# Patient Record
Sex: Male | Born: 1985 | Race: White | Hispanic: No | Marital: Single | Smoking: Never smoker
Health system: Southern US, Community
[De-identification: ages and names within clinical notes are randomized; demographics above are authoritative.]

---
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
## 2005-09-30 ENCOUNTER — Emergency Department (HOSPITAL_COMMUNITY): Admission: EM | Admit: 2005-09-30 | Discharge: 2005-09-30 | Payer: Self-pay | Admitting: Emergency Medicine

## 2007-12-22 ENCOUNTER — Encounter: Admission: RE | Admit: 2007-12-22 | Discharge: 2007-12-22 | Payer: Self-pay | Admitting: Family Medicine

## 2009-08-21 ENCOUNTER — Emergency Department (HOSPITAL_COMMUNITY): Admission: EM | Admit: 2009-08-21 | Discharge: 2009-08-21 | Payer: Self-pay | Admitting: Emergency Medicine

## 2009-08-29 ENCOUNTER — Encounter: Admission: RE | Admit: 2009-08-29 | Discharge: 2009-08-29 | Payer: Self-pay | Admitting: Family Medicine

## 2018-01-20 ENCOUNTER — Encounter (INDEPENDENT_AMBULATORY_CARE_PROVIDER_SITE_OTHER): Payer: Self-pay | Admitting: Orthopaedic Surgery

## 2018-01-20 ENCOUNTER — Ambulatory Visit (INDEPENDENT_AMBULATORY_CARE_PROVIDER_SITE_OTHER): Payer: BLUE CROSS/BLUE SHIELD

## 2018-01-20 ENCOUNTER — Ambulatory Visit (INDEPENDENT_AMBULATORY_CARE_PROVIDER_SITE_OTHER): Payer: BLUE CROSS/BLUE SHIELD | Admitting: Orthopaedic Surgery

## 2018-01-20 DIAGNOSIS — M545 Low back pain: Secondary | ICD-10-CM

## 2018-01-20 MED ORDER — METHOCARBAMOL 500 MG PO TABS: ORAL_TABLET | ORAL | 0 refills | Status: DC

## 2018-01-20 MED ORDER — PREDNISONE 10 MG (21) PO TBPK
ORAL_TABLET | ORAL | 0 refills | Status: DC
Start: 2018-01-20 — End: 2018-12-28

## 2018-01-20 MED ORDER — NAPROXEN 500 MG PO TABS: ORAL_TABLET | ORAL | 1 refills | Status: DC

## 2018-01-20 NOTE — Progress Notes (Signed)
Office Visit Note   Patient: Jeremy Maldonado           Date of Birth: 02-23-1986           MRN: [id number] Visit Date: 01/20/2018              Requested by: No referring provider defined for this encounter. PCP: Jarome Matin, MD   Assessment & Plan: Visit Diagnoses:  1. Acute low back pain, unspecified back pain laterality, with sciatica presence unspecified     Plan: Impression is lumbar strain.  We will place the patient on muscle relaxers and a Sterapred taper.  Once he has finished the Sterapred taper, he will start with the Naprosyn.  He will take all of these with food and I have suggested also taking these with an H2 blocker or a PPI.  He will avoid strenuous activity for the next few weeks.  I have told him that it is okay to participate in yoga or pool activities.  He will follow-up with [place] in 3 weeks time for recheck.  That point, we may send him to formal physical therapy.  Call with concerns or questions in the meantime.  Follow-Up Instructions: Return in about 3 weeks (around 02/10/2018).   Orders:  Orders Placed This Encounter  Procedures  . XR Lumbar Spine 2-3 Views   Meds ordered this encounter  Medications  . methocarbamol (ROBAXIN) 500 MG tablet    Sig: Take one tab po bid prn muscle spasm/pain    Dispense:  20 tablet    Refill:  0  . predniSONE (STERAPRED UNI-PAK 21 TAB) 10 MG (21) TBPK tablet    Sig: Take as directed    Dispense:  21 tablet    Refill:  0  . naproxen (NAPROSYN) 500 MG tablet    Sig: Take one tab po bid with meals prn pain (do not start taking until finished with sterapred taper)    Dispense:  30 tablet    Refill:  1      Procedures: No procedures performed   Clinical Data: No additional findings.   Subjective: Chief Complaint  Patient presents with  . Lower Back - Pain    HPI patient is a pleasant 32 year old gentleman who presents to our clinic today with lower back pain.  This began approximately 4 days ago without  any known injury or change in activity.  He is a very active gentleman who plays a lot of lacrosse and runs approximately 5 miles several times a week.  The pain he has been having is to both sides of his lower back radiating into the groin and going down the top of his legs and into both ankles.  He describes this as a constant ache worse when he is sitting or lying.  He has tried Aleve and Tylenol without relief of symptoms.  He did try one Norco which did ease the pain.  He has gotten a little relief from sitting in a hot tub as well as using a heating pad.  No numbness, tingling or burning down either extremity.  No bowel or bladder change and no saddle paresthesias.  No fevers, chills or any other systemic symptoms.  No previous lumbar pathology.  Review of Systems as detailed in HPI.  All others reviewed and are negative.   Objective: Vital Signs: There were no vitals taken for this visit.  Physical Exam well-developed and well-nourished gentleman in no acute distress.  Alert and oriented x3.  Ortho Exam examination of his lumbar spine reveals increased pain with lumbar flexion.  He does get some relief with lumbar extension.  No spinous tenderness.  He does have slight bilateral paraspinous tenderness.  Positive straight leg raise both sides.  Negative logroll and negative Fater both sides.  No focal weakness.  He is neurovascularly intact distally.  Specialty Comments:  No specialty comments available.  Imaging: Xr Lumbar Spine 2-3 Views  Result Date: 01/20/2018 No acute or structural abnormalities    PMFS History: There are no active problems to display for this patient.  History reviewed. No pertinent past medical history.  History reviewed. No pertinent family history.  History reviewed. No pertinent surgical history. Social History   Occupational History  . Not on file  Tobacco Use  . Smoking status: Not on file  Substance and Sexual Activity  . Alcohol use: Not on file    . Drug use: Not on file  . Sexual activity: Not on file

## 2018-01-21 ENCOUNTER — Telehealth (INDEPENDENT_AMBULATORY_CARE_PROVIDER_SITE_OTHER): Payer: Self-pay | Admitting: Orthopaedic Surgery

## 2018-01-21 NOTE — Telephone Encounter (Signed)
FYI

## 2018-01-21 NOTE — Telephone Encounter (Signed)
Please advise 

## 2018-01-21 NOTE — Telephone Encounter (Signed)
Patient saw Jeremy Maldonado yesterday and has some questions about the medication he was given. He told her he takes a low dosage of hydrocodone from a previous procedure, he also took a prednisone last night and he said he got about 2 hours of sleep and is feeling very nauseous and out of it today. He is wondering if this is a side effect from taking them both together. He just wants to be sure hes taking the right meds.  Also patient is wondering if he can be given a work not for yesterday and today due to appt and also not being able to go in because of medication problem.  Please advise when possible # [phone]

## 2018-01-21 NOTE — Telephone Encounter (Signed)
I also gave him robaxin and my guess is that he took that last night with the norco.  The robaxin can make him sleepy alone.  I would only take robaxin at night, and if needed, take the norco during the day.  Still take the prednisone as this should not make him drowsy.

## 2018-01-22 NOTE — Telephone Encounter (Signed)
Tried to call patient no answer, LMOM. To return call to advise on message below.

## 2018-01-22 NOTE — Telephone Encounter (Signed)
Tried to call patient no answer. Will you try to call him tomorrow, since I will be out of the office please. Thank you.

## 2018-01-23 ENCOUNTER — Encounter (INDEPENDENT_AMBULATORY_CARE_PROVIDER_SITE_OTHER): Payer: Self-pay

## 2018-01-23 NOTE — Telephone Encounter (Signed)
I just tried to call as well. Voicemail not set up to leave a message

## 2018-01-23 NOTE — Telephone Encounter (Signed)
IC: patient said he did not take any of the Robaxin, so it was not that that caused his symptoms.  He is feeling much better now, and was able to return to work on 01/22/18. I mailed a work note to him for 7/09-7/10/19.

## 2018-02-06 ENCOUNTER — Telehealth (INDEPENDENT_AMBULATORY_CARE_PROVIDER_SITE_OTHER): Payer: Self-pay | Admitting: Orthopaedic Surgery

## 2018-02-06 NOTE — Telephone Encounter (Signed)
Patient called checking status of request for records. I advised him records were mailed on 7/23 and it may take a little longer to get to him as our mail is out sourced. Told him if he needs sooner than it arrives to call me back.

## 2018-02-10 ENCOUNTER — Encounter (INDEPENDENT_AMBULATORY_CARE_PROVIDER_SITE_OTHER): Payer: Self-pay | Admitting: Orthopaedic Surgery

## 2018-02-10 ENCOUNTER — Ambulatory Visit (INDEPENDENT_AMBULATORY_CARE_PROVIDER_SITE_OTHER): Payer: BLUE CROSS/BLUE SHIELD | Admitting: Physician Assistant

## 2018-02-10 DIAGNOSIS — M545 Low back pain, unspecified: Secondary | ICD-10-CM | POA: Insufficient documentation

## 2018-02-10 NOTE — Progress Notes (Signed)
      Patient: Jeremy Maldonado           Date of Birth: 09/11/1985           MRN: [id number] Visit Date: 02/10/2018 PCP: Jarome MatinPaterson, Daniel, MD   Assessment & Plan:  Chief Complaint:  Chief Complaint  Patient presents with  . Lower Back - Pain, Follow-up   Visit Diagnoses:  1. Acute low back pain, unspecified back pain laterality, with sciatica presence unspecified     Plan: Patient comes in for follow-up for lumbar strain.  This occurred approximately 4 weeks ago.  He was seen by us where he was placed on a Sterapred taper as well as given a prescription for muscle relaxers.  He finished a steroid taper and only took a few muscle relaxers.  His pain has dramatically improved.  He has been avoiding physical activity over the past few weeks.  Overall doing dramatically better.  Lumbar exam is stable.  At this point, we will send the patient to formal physical therapy for 1 session for him to be instructed on core strengthening exercises and others that he is able to do on his own at home.  He will gradually increase activity as tolerated.  Follow-up with us as needed.  Follow-Up Instructions: Return if symptoms worsen or fail to improve.   Orders:  No orders of the defined types were placed in this encounter.  No orders of the defined types were placed in this encounter.   Imaging: No results found.  PMFS History: Patient Active Problem List   Diagnosis Date Noted  . Acute low back pain 02/10/2018   History reviewed. No pertinent past medical history.  History reviewed. No pertinent family history.  History reviewed. No pertinent surgical history. Social History   Occupational History  . Not on file  Tobacco Use  . Smoking status: Never Smoker  . Smokeless tobacco: Never Used  Substance and Sexual Activity  . Alcohol use: Not on file  . Drug use: Not on file  . Sexual activity: Not on file

## 2018-12-28 ENCOUNTER — Encounter: Payer: Self-pay | Admitting: Family Medicine

## 2018-12-28 ENCOUNTER — Other Ambulatory Visit: Payer: Self-pay

## 2018-12-28 ENCOUNTER — Ambulatory Visit (INDEPENDENT_AMBULATORY_CARE_PROVIDER_SITE_OTHER): Payer: BLUE CROSS/BLUE SHIELD | Admitting: Family Medicine

## 2018-12-28 VITALS — BP 143/81 | HR 72 | Temp 97.4°F | Resp 12 | Ht 71.75 in | Wt 205.5 lb

## 2018-12-28 DIAGNOSIS — M545 Low back pain, unspecified: Secondary | ICD-10-CM

## 2018-12-28 DIAGNOSIS — Z8639 Personal history of other endocrine, nutritional and metabolic disease: Secondary | ICD-10-CM | POA: Diagnosis not present

## 2018-12-28 DIAGNOSIS — Z0000 Encounter for general adult medical examination without abnormal findings: Secondary | ICD-10-CM | POA: Diagnosis not present

## 2018-12-28 DIAGNOSIS — Z8249 Family history of ischemic heart disease and other diseases of the circulatory system: Secondary | ICD-10-CM

## 2018-12-28 DIAGNOSIS — E785 Hyperlipidemia, unspecified: Secondary | ICD-10-CM

## 2018-12-28 DIAGNOSIS — G8929 Other chronic pain: Secondary | ICD-10-CM

## 2018-12-28 NOTE — Progress Notes (Signed)
Office Visit Note   Patient: Jeremy SicklesRandall S Sowash           Date of Birth: 10/04/1985           MRN: [id number] Visit Date: 12/28/2018 Requested by: Jarome MatinPaterson, Daniel, MD [address] [place],  [place] [id number] PCP: Jarome MatinPaterson, Daniel, MD  Subjective: Chief Complaint  Patient presents with  . wellness exam    HPI: He is here for a wellness examination.  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  Fortunately he had an event occur that led to his placement in [facility].  He spent a couple weeks there and is now "clean".  He is very grateful for this.  He has been dealing with chronic low back pain which led to his hydrocodone addiction.  He is not sure exactly what caused his pain but it has been bothering him for at least a year.  He thinks it might be related to years of lacrosse activities.  The pain is midline lumbosacral area, worse on the left side, with occasional radiation into the legs but no numbness or weakness.  This morning he is actually feeling pretty well.  Exercise seems to improve his pain temporarily.  He has a family history of early heart attack in his father who died at age 242.  His father was also a smoker.  His paternal grandfather also died young but causes uncertain.  He has many other relatives with no heart disease, often people live into their 90s.  His paternal uncle is alive and well in his 3270s.  During his recent stay [facility], he had labs showing a very mildly elevated glucose and elevated lipids.  Hemoglobin A1c was normal at 5.4.  Patient states that leading up to that, he was not eating healthfully but is now eating well and exercising regularly again.  There is no family history of diabetes to his knowledge.  He is up-to-date on eye exams, dental visits, immunizations.  He is working for [employer] and enjoys his job.                ROS: Denies fevers or chills.  All other systems were reviewed and are negative.   Objective: Vital Signs: BP (!) 143/81 (BP Location: Right Arm, Patient Position: Sitting, Cuff Size: Normal)   Pulse 72   Temp (!) 97.4 F (36.3 C)   Resp 12   Ht 5' 11.75" (1.822 m)   Wt 205 lb 8 oz (93.2 kg)   SpO2 98%   BMI 28.07 kg/m   Physical Exam:  General:  Alert and oriented, in no acute distress. Pulm:  Breathing unlabored. Psy:  Normal mood, congruent affect. Skin: No rash on his skin. HEENT:  [place]/AT, PERRLA, EOM Full, no nystagmus.  Funduscopic examination within normal limits.  No conjunctival erythema.  Tympanic membranes are pearly gray with normal landmarks.  External ear canals are normal.  Nasal passages are clear.  Oropharynx is clear.  No significant lymphadenopathy.  No thyromegaly or nodules.  2+ carotid pulses without bruits. CV: Regular rate and rhythm without murmurs, rubs, or gallops.  No peripheral edema.  2+ radial and posterior tibial pulses. Lungs: Clear to auscultation throughout with no wheezing or areas of consolidation. Abd: Bowel sounds are active, no hepatosplenomegaly or masses.  Soft and nontender.  No audible bruits.  No evidence of ascites. Low back: No scoliosis, leg lengths appear equal.  He is tender to deep palpation in the  paraspinous muscles immediately to the left of L5 spinous process.  He also has pain with 1 legged hyperextension right greater than left, and the pain is mostly on the left side.  Lower extremity strength and reflexes are normal.   Imaging: None today.  Previous lumbar x-rays show early L4-5 facet degenerative changes.  Assessment & Plan: 1.  Wellness examination -Overall I think he is doing well from a health standpoint.  I am not too concerned about his recent blood sugar and cholesterol numbers.  He is now maintaining a healthy lifestyle and we will recheck fasting labs in about a year.  2.  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 - We will treat aggressively with physical  therapy and chiropractic. -If symptoms do not improve, then possibly lumbar MRI scan.  3.  Family history of early heart disease in father - When he reaches age 60 we will order a CT calcium score.     Procedures: No procedures performed  No notes on file     PMFS History: Patient Active Problem List   Diagnosis Date Noted  . Family history of early CAD 12/28/2018   History reviewed. No pertinent past medical history.  Family History  Problem Relation Age of Onset  . Osteoarthritis Mother   . Heart attack Father 25  . Hyperlipidemia Father   . Cancer Neg Hx     History reviewed. No pertinent surgical history. Social History   Occupational History  . Not on file  Tobacco Use  . Smoking status: Never Smoker  . Smokeless tobacco: Never Used  Substance and Sexual Activity  . Alcohol use: Not on file  . Drug use: Not on file  . Sexual activity: Not on file

## 2018-12-28 NOTE — Patient Instructions (Signed)
    Vitamin D3:  Take 5,000 IU daily Vitamin K2:  100 mcg daily Magnesium:  200-400 mg daily  Glucosamine Sulfate:  1,000 mg twice daily

## 2019-01-21 ENCOUNTER — Encounter: Payer: Self-pay | Admitting: Family Medicine

## 2019-02-08 ENCOUNTER — Encounter: Payer: Self-pay | Admitting: Family Medicine

## 2019-03-24 ENCOUNTER — Encounter: Payer: Self-pay | Admitting: Family Medicine

## 2019-04-23 ENCOUNTER — Other Ambulatory Visit: Payer: Self-pay

## 2019-04-23 ENCOUNTER — Encounter: Payer: Self-pay | Admitting: Family Medicine

## 2019-04-23 ENCOUNTER — Ambulatory Visit (INDEPENDENT_AMBULATORY_CARE_PROVIDER_SITE_OTHER): Payer: BLUE CROSS/BLUE SHIELD | Admitting: Family Medicine

## 2019-04-23 DIAGNOSIS — M545 Low back pain, unspecified: Secondary | ICD-10-CM

## 2019-04-23 DIAGNOSIS — G8929 Other chronic pain: Secondary | ICD-10-CM | POA: Diagnosis not present

## 2019-04-23 DIAGNOSIS — F1129 Opioid dependence with unspecified opioid-induced disorder: Secondary | ICD-10-CM

## 2019-04-23 MED ORDER — QUETIAPINE FUMARATE 25 MG PO TABS: 25.0000 mg | ORAL_TABLET | Freq: Every evening | ORAL | 0 refills | Status: DC | PRN

## 2019-04-23 NOTE — Progress Notes (Signed)
   Office Visit Note   Patient: Jeremy Maldonado           Date of Birth: November 10, 1985           MRN: [id number] Visit Date: 04/23/2019 Requested by: Leanna Battles, MD [address] [place],   [address] PCP: Leanna Battles, MD  Subjective: Chief Complaint  Patient presents with  . Lower Back - Pain    States low back pain for a while.  Pain stays in lower back. No pain while walking, sitting, or standing. No Pain medicine.  Has been to drug treatment.    HPI: He is here for right-sided low back pain.  Pain in low back for at least a year.  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  He is now working with [facility] and is going through withdrawals, but plans to be successful this time around.  He is living with his mother who is with him today.  He would like help managing his back pain, and would also like a prescription for Seroquel to help him sleep, and a referral to a psychiatrist to possibly prescribe vivitrol.  For his back pain, he underwent a course of chiropractic per Dr. Jimmye Norman but unfortunately it really did not make much difference in his pain.  He does not have radicular pain.  No bowel or bladder dysfunction.              ROS: No fevers or chills.  All other systems were reviewed and are negative.  Objective: Vital Signs: There were no vitals taken for this visit.  Physical Exam:  General:  Alert and oriented, in no acute distress. Pulm:  Breathing unlabored. Psy:  Normal mood, congruent affect. Skin: No visible rash. Low back: His tenderness today is in the deep paraspinous muscles to the right of midline at about L4-5.  No significant spinous process tenderness, no tenderness over the SI joints today.  Negative straight leg raise, lower extremity strength and reflexes are normal.  Imaging:  None today.  Assessment & Plan: 1.  Chronic right-sided low back pain, possibly myofascial.  Question facet syndrome. -We will try intramuscular injection into the myofascial trigger point today.  Referral to [facility] for physical therapy.  If he fails to improve, then lumbar MRI scan.  2.  Opioid dependence -He is working with [employer], [employer].  Prescription given for Seroquel to help him sleep.  Referral to psychiatry.     Procedures: No procedures performed  No notes on file     PMFS History: Patient Active Problem List   Diagnosis Date Noted  . Family history of early CAD 12/28/2018   History reviewed. No pertinent past medical history.  Family History  Problem Relation Age of Onset  . Osteoarthritis Mother   . Heart attack Father 46  . Hyperlipidemia Father   . Cancer Neg Hx     History reviewed. No pertinent surgical history. Social History   Occupational History  . Not on file  Tobacco Use  . Smoking status: Never Smoker  . Smokeless tobacco: Never Used  Substance and Sexual Activity  . Alcohol use: Not on file  . Drug use: Not on file  . Sexual activity: Not on file

## 2019-04-28 ENCOUNTER — Telehealth (HOSPITAL_COMMUNITY): Payer: Self-pay | Admitting: Psychiatry

## 2019-04-28 NOTE — Telephone Encounter (Signed)
D:  Referral Coordinator for [facility] referred pt to [facility].  A:  Placed call to orient and provide pt with a start date.  According to pt, he just completed [facility] and can't attend [facility] due to his work schedule.  Pt is just requesting a psychiatrist who administers Vivotrol.  Pt states he was given two clinics/offices who do it.  Writer recommended pt to give Dr. Ephriam Jenkins office a call.  Also, encouraged pt to obtain a sponsor and attend AA/NA mgts.  R:  Pt receptive.

## 2019-06-24 ENCOUNTER — Other Ambulatory Visit: Payer: Self-pay

## 2019-06-24 DIAGNOSIS — Z20822 Contact with and (suspected) exposure to covid-19: Secondary | ICD-10-CM

## 2019-06-26 LAB — NOVEL CORONAVIRUS, NAA: SARS-CoV-2, NAA: DETECTED — AB

## 2020-08-03 ENCOUNTER — Ambulatory Visit (INDEPENDENT_AMBULATORY_CARE_PROVIDER_SITE_OTHER): Payer: Managed Care, Other (non HMO)

## 2020-08-03 ENCOUNTER — Ambulatory Visit (INDEPENDENT_AMBULATORY_CARE_PROVIDER_SITE_OTHER): Payer: Managed Care, Other (non HMO) | Admitting: Family Medicine

## 2020-08-03 ENCOUNTER — Other Ambulatory Visit: Payer: Self-pay

## 2020-08-03 DIAGNOSIS — M25562 Pain in left knee: Secondary | ICD-10-CM

## 2020-08-03 DIAGNOSIS — M25362 Other instability, left knee: Secondary | ICD-10-CM

## 2020-08-03 NOTE — Progress Notes (Signed)
Office Visit Note   Patient: Jeremy Maldonado           Date of Birth: 06/26/1986           MRN: [id number] Visit Date: 08/03/2020 Requested by: Lavada Mesi, MD [address] [place],  [place] [address] PCP: Lavada Mesi, MD  Subjective: Chief Complaint  Patient presents with  . Left Knee - Pain    Injured 3 days ago, doing jiu jitsu drills. His partner put him into submission with a move called a leg bar. The patient heard and felt a pop, but it did not hurt initially. Started hurting that evening at home and worsened by the next day. Pain medial aspect of knee. Walks with a limp. Been icing, heating and elevating the leg. He said the knee has that "empty" feeling, like when he tore the ACL on the right knee.     HPI: He is here with left knee instability.  3 days ago practicing jujitsu, his partner put him into a submission hold and his knee popped.  It really did not hurt right then and everything seemed to be okay, so he finished his workout.  After he went home and had dinner, he stood up and his knee did not feel right.  He tried some maneuvers and never noticed any swelling, but the knee continued to feel a bit unstable.  Over the next couple days it is starting to feel "empty", just like when he tore his right knee ACL.  His right knee is doing very well, he had reconstruction per Dr. August Saucer.                ROS:   All other systems were reviewed and are negative.  Objective: Vital Signs: There were no vitals taken for this visit.  Physical Exam:  General:  Alert and oriented, in no acute distress. Pulm:  Breathing unlabored. Psy:  Normal mood, congruent affect. Skin: No bruising Left knee: There is trace effusion, no warmth.  No patellofemoral crepitus, negative patella apprehension.  I am unable to feel a solid endpoint with Lachman's.  Anterior drawer has laxity as well, PCL feels solid.  There is slight pain with valgus stress but a solid endpoint.  No palpable click with  McMurray's.  Imaging: XR Knee 1-2 Views Left  Result Date: 08/03/2020 X-rays show normal anatomic alignment of the knee with no degenerative change, no sign of loose body, no Segond fracture.   Assessment & Plan: 1. left knee instability, worrisome for ACL tear. -Elected to proceed with MRI scan.  If ACL torn, we will have him meet with Dr. August Saucer.     Procedures: No procedures performed        PMFS History: Patient Active Problem List   Diagnosis Date Noted  . Family history of early CAD 12/28/2018   No past medical history on file.  Family History  Problem Relation Age of Onset  . Osteoarthritis Mother   . Heart attack Father 80  . Hyperlipidemia Father   . Cancer Neg Hx     No past surgical history on file. Social History   Occupational History  . Not on file  Tobacco Use  . Smoking status: Never Smoker  . Smokeless tobacco: Never Used  Substance and Sexual Activity  . Alcohol use: Not on file  . Drug use: Not on file  . Sexual activity: Not on file

## 2020-08-27 ENCOUNTER — Other Ambulatory Visit: Payer: Managed Care, Other (non HMO)

## 2020-09-15 ENCOUNTER — Ambulatory Visit
Admission: RE | Admit: 2020-09-15 | Discharge: 2020-09-15 | Disposition: A | Discharge status: Home / Self Care | Payer: Managed Care, Other (non HMO) | Source: Ambulatory Visit | Attending: Family Medicine | Admitting: Family Medicine

## 2020-09-15 ENCOUNTER — Other Ambulatory Visit: Payer: Self-pay

## 2020-09-15 DIAGNOSIS — M25362 Other instability, left knee: Secondary | ICD-10-CM

## 2020-09-15 DIAGNOSIS — M25562 Pain in left knee: Secondary | ICD-10-CM

## 2020-09-18 ENCOUNTER — Telehealth: Payer: Self-pay | Admitting: Family Medicine

## 2020-09-18 NOTE — Telephone Encounter (Signed)
Good news!  ACL and meniscus cartilages do not look torn.  Just bone contusions, which will heal with rest and time.

## 2021-09-04 IMAGING — MR MR KNEE*L* W/O CM
6 of 7 series · 29 of 40 positions shown · non-contrast
Comparison: Plain films left knee 08/03/2020. MRI left knee
12/23/2007.

CLINICAL DATA: Left knee pain since the patient felt a pop in his
knee while doing jujitsu 2 months ago.

EXAM:
MRI OF THE LEFT KNEE WITHOUT CONTRAST
TECHNIQUE: Multiplanar, multisequence MR imaging of the knee was performed. No
intravenous contrast was administered.

[Series 3: T2 fat-sat · axial · 4.0mm · 0.50mm/px · z∈[-52,+63]mm · 6 of 24 slices shown (1 of 3)]
[im 1/24]
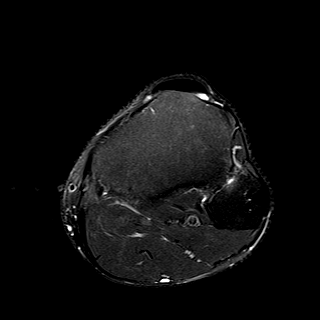
[im 5/24]
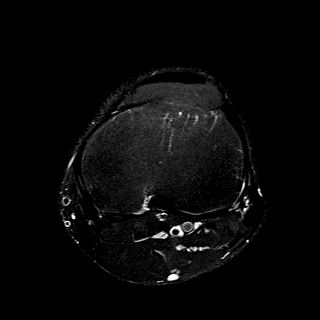
[im 10/24]
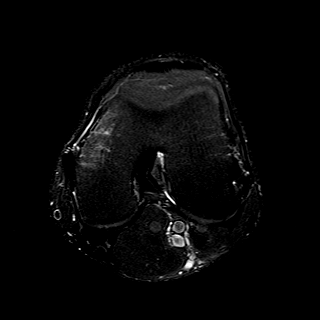
[im 14/24]
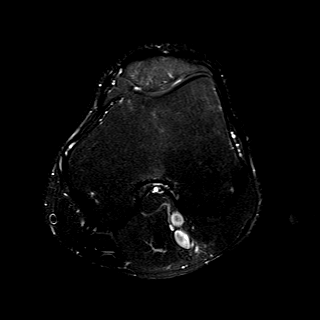
[im 19/24]
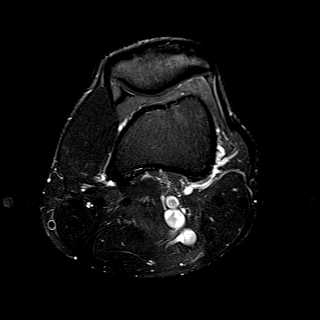
[im 24/24]
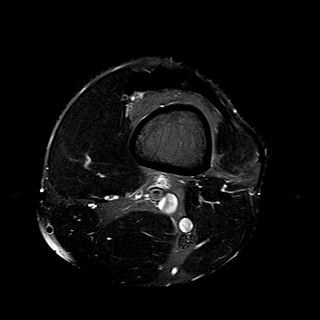

[Series 5: T2 fat-sat · coronal · 4.0mm · 0.29mm/px · 5 of 22 slices shown (2 of 3)]
[im 1/22]
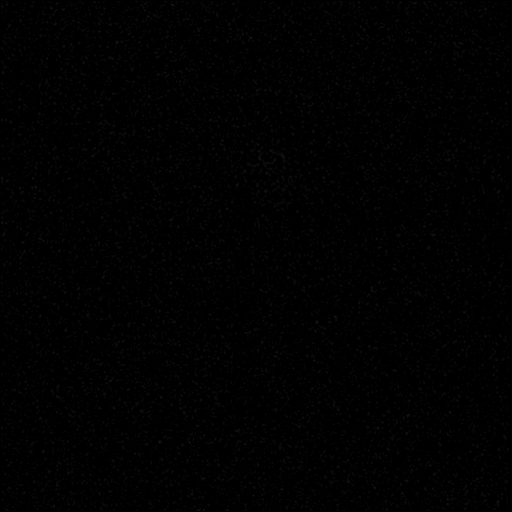
[im 6/22]
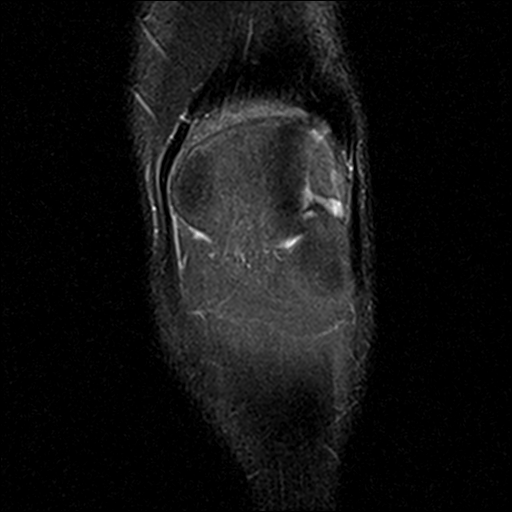
[im 11/22]
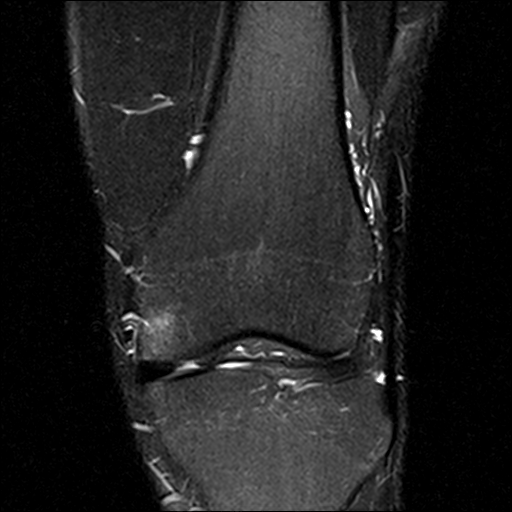
[im 16/22]
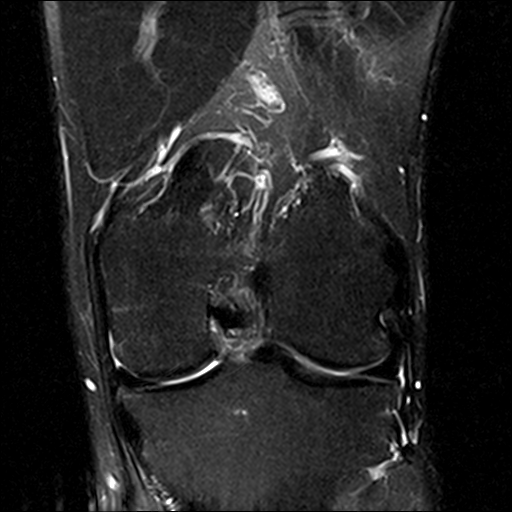
[im 22/22]
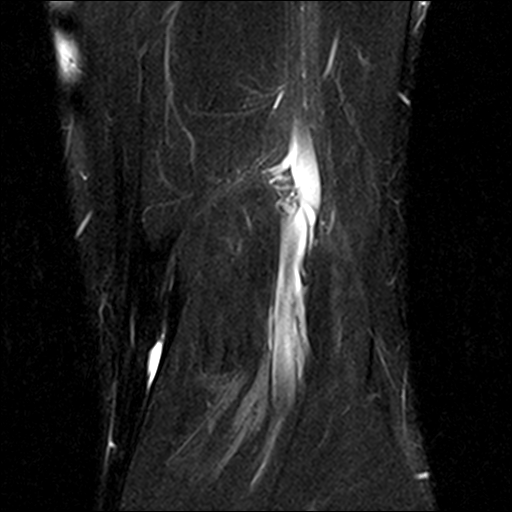

[Series 6: T2 fat-sat · sagittal · 3.0mm · 0.29mm/px · 1 of 29 slices shown (3 of 3)]
[im 1/29]
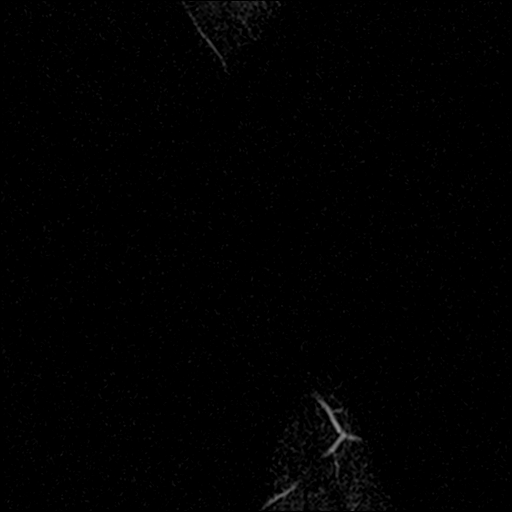

[Series 7: PD fat-sat · sagittal · 3.0mm · 0.39mm/px · 7 of 29 slices shown (1 of 3)]
[im 1/29]
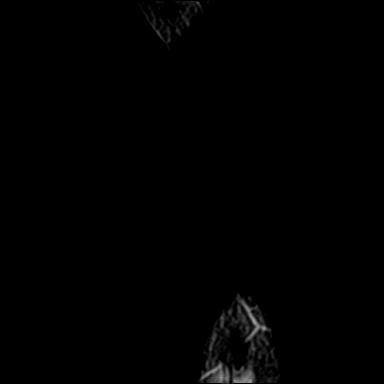
[im 5/29]
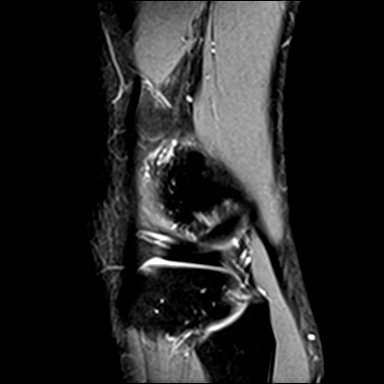
[im 10/29]
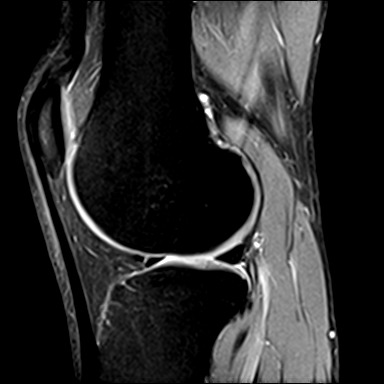
[im 15/29]
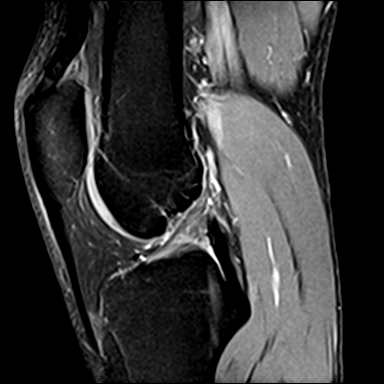
[im 19/29]
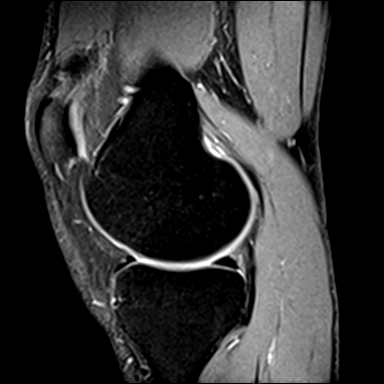
[im 24/29]
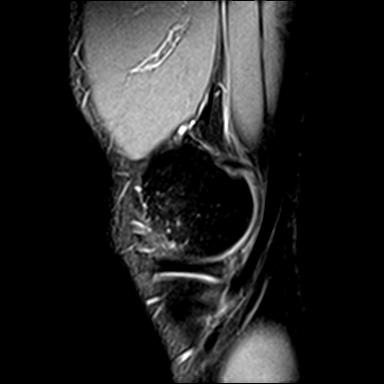
[im 29/29]
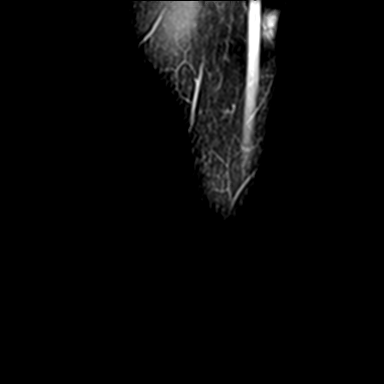

[Series 8: PD fat-sat · coronal · 3.0mm · 0.39mm/px · 7 of 28 slices shown (2 of 3)]
[im 1/28]
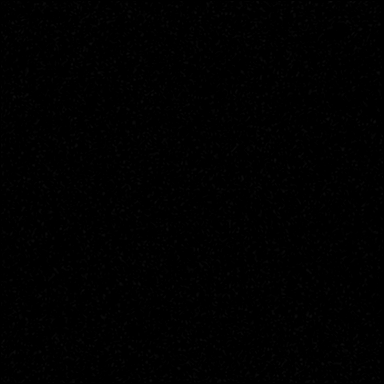
[im 5/28]
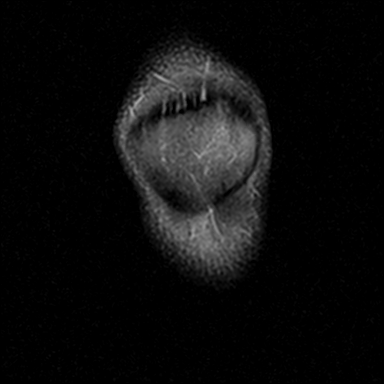
[im 10/28]
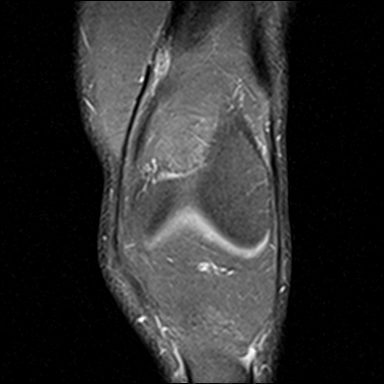
[im 14/28]
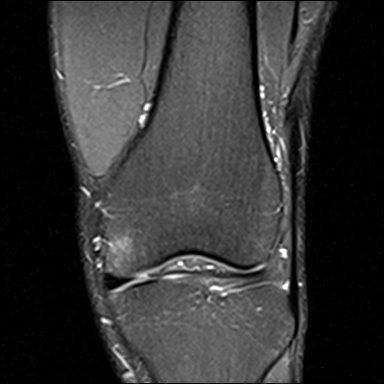
[im 19/28]
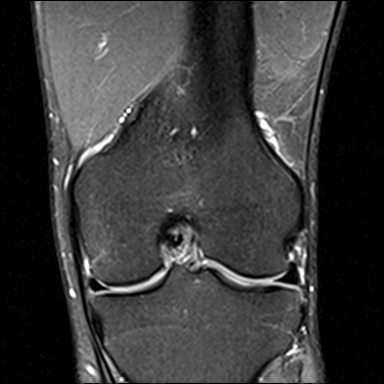
[im 23/28]
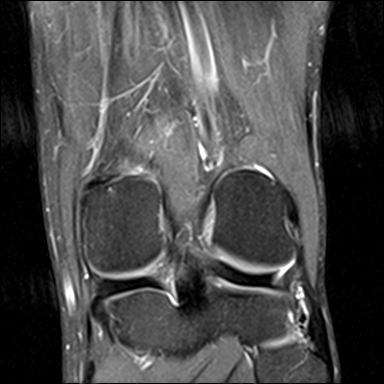
[im 28/28]
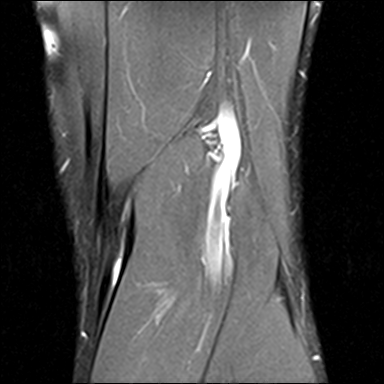

[Series 9: PD fat-sat · oblique · 2.0mm · 0.29mm/px · 3 of 11 slices shown (3 of 3)]
[im 1/11]
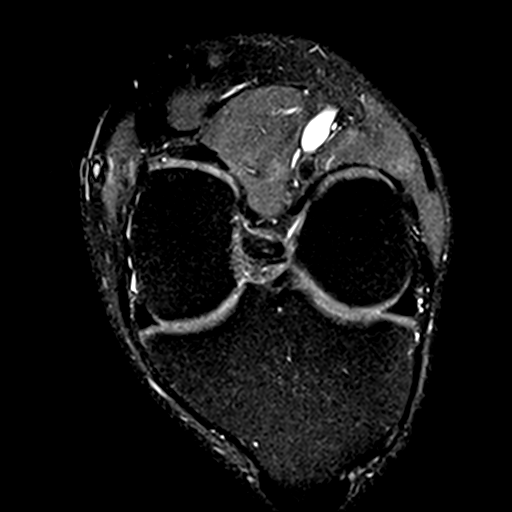
[im 6/11]
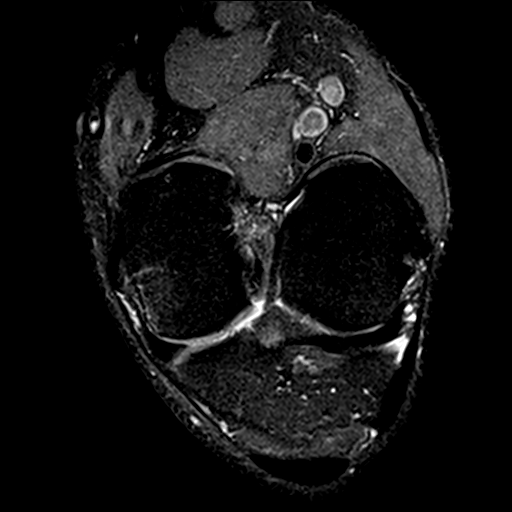
[im 11/11]
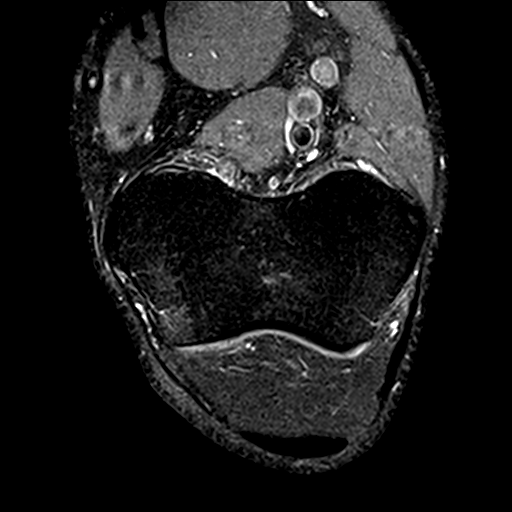

[29 of 40 positions shown; findings below may reference images not displayed]

FINDINGS: MENISCI

Medial meniscus:  Intact.

Lateral meniscus:  Intact.

LIGAMENTS

Cruciates:  Intact.

Collaterals:  Intact.

CARTILAGE

Patellofemoral:  Normal.

Medial:  Normal.

Lateral:  Normal.

Joint:  No effusion.

Popliteal Fossa:  No Baker's cyst.

Extensor Mechanism:  Intact.

Bones: Small focus of mild marrow edema is seen in the anterior
periphery of the medial femoral condyle. No fracture.

Other: None.
IMPRESSION: Small focus of mild marrow edema in the medial femoral condyle and
may be due to contusion or stress change. Negative for fracture.

Negative for meniscal or ligament tear.

## 2022-08-20 ENCOUNTER — Ambulatory Visit
Admission: EM | Admit: 2022-08-20 | Discharge: 2022-08-20 | Disposition: A | Discharge status: Home / Self Care | Payer: BC Managed Care – PPO | Attending: Physician Assistant | Admitting: Physician Assistant

## 2022-08-20 DIAGNOSIS — U071 COVID-19: Secondary | ICD-10-CM | POA: Diagnosis present

## 2022-08-20 DIAGNOSIS — Z8249 Family history of ischemic heart disease and other diseases of the circulatory system: Secondary | ICD-10-CM

## 2022-08-20 DIAGNOSIS — Z1152 Encounter for screening for COVID-19: Secondary | ICD-10-CM | POA: Insufficient documentation

## 2022-08-20 NOTE — Discharge Instructions (Signed)
COVID test will be completed in 48 hours.  If you do not hear from the office in 48 hours that indicates the test is negative.  Log onto MyChart to review the test results when it post in 48 hours.  Advised to follow CDC guidelines with masking and isolation for 5 days from the start of symptoms or positive test.  Advised take Tylenol or ibuprofen as needed for body aches, pain or fever. Advised to take Mucinex DM or Robitussin DM for cough and congestion.  Advised follow-up PCP or return to urgent care as needed.

## 2022-08-20 NOTE — ED Triage Notes (Signed)
Pt states had a slight headache yesterday, more intense today and woke up today congestion. States had a positive home COVID test.

## 2022-08-20 NOTE — ED Provider Notes (Signed)
[facility]    CSN: [id number] Arrival date & time: 08/20/22  0843      History   Chief Complaint Chief Complaint  Patient presents with   Covid Positive    HPI Jeremy Maldonado is a 37 y.o. male.   37 year old male presents for COVID testing.  Patient indicates that he woke up yesterday having headache, cough, congestion with mainly clear production.  He relates that he had some mild chest congestion that was clear production also without any wheezing or shortness of breath.  He indicates he has not had any fever, chills, body aches.  He indicates he has had dull mild headache that started yesterday and has continued today.  He indicates he did a COVID test yesterday which was positive.  He is here today to have a repeat COVID test as he was advised by occupational health at work to observe the 5-day isolation and masking.  Patient indicates he is doing well.  He is without nausea or vomiting     History reviewed. No pertinent past medical history.  Patient Active Problem List   Diagnosis Date Noted   Family history of early CAD 12/28/2018    History reviewed. No pertinent surgical history.     Home Medications    Prior to Admission medications   Medication Sig Start Date End Date Taking? Authorizing Provider  ibuprofen (ADVIL) 200 MG tablet Take 200 mg by mouth every 8 (eight) hours as needed.    [provider]    Family History Family History  Problem Relation Age of Onset   Osteoarthritis Mother    Heart attack Father 68   Hyperlipidemia Father    Cancer Neg Hx     Social History Social History   Tobacco Use   Smoking status: Never   Smokeless tobacco: Never  Substance Use Topics   Alcohol use: Yes    Comment: occ   Drug use: Not Currently     Allergies   Patient has no known allergies.   Review of Systems Review of Systems  Respiratory:  Positive for cough.   Neurological:  Positive for headaches.     Physical  Exam Triage Vital Signs ED Triage Vitals [08/20/22 0851]  Enc Vitals Group     BP (!) 140/96     Pulse Rate 85     Resp 18     Temp 98.1 F (36.7 C)     Temp Source Oral     SpO2 99 %     Weight      Height      Head Circumference      Peak Flow      Pain Score 0     Pain Loc      Pain Edu?      Excl. in [place]?    No data found.  Updated Vital Signs BP (!) 140/96 (BP Location: Left Arm)   Pulse 85   Temp 98.1 F (36.7 C) (Oral)   Resp 18   SpO2 99%   Visual Acuity Right Eye Distance:   Left Eye Distance:   Bilateral Distance:    Right Eye Near:   Left Eye Near:    Bilateral Near:     Physical Exam Constitutional:      Appearance: Normal appearance.  HENT:     Right Ear: Tympanic membrane and ear canal normal.     Left Ear: Tympanic membrane and ear canal normal.     Mouth/Throat:  Mouth: Mucous membranes are moist.     Pharynx: Oropharynx is clear.  Cardiovascular:     Rate and Rhythm: Normal rate and regular rhythm.     Heart sounds: Normal heart sounds.  Pulmonary:     Effort: Pulmonary effort is normal.     Breath sounds: Normal breath sounds and air entry. No wheezing, rhonchi or rales.  Neurological:     Mental Status: He is alert.      UC Treatments / Results  Labs (all labs ordered are listed, but only abnormal results are displayed) Labs Reviewed  SARS CORONAVIRUS 2 (TAT 6-24 HRS)    EKG   Radiology No results found.  Procedures Procedures (including critical care time)  Medications Ordered in UC Medications - No data to display  Initial Impression / Assessment and Plan / UC Course  I have reviewed the triage vital signs and the nursing notes.  Pertinent labs & imaging results that were available during my care of the patient were reviewed by me and considered in my medical decision making (see chart for details).    Plan: The diagnosis will be treated with the following: 1.  Screening for COVID-19: A.  Treatment may be  modified depending on results of COVID-19 test. 2.  COVID: A.  Advised to treat the symptoms, Tylenol or Motrin as needed for fever body aches, Robitussin or Mucinex for cough and congestion. 3.  Advised follow-up PCP or return to urgent care as needed. Final Clinical Impressions(s) / UC Diagnoses   Final diagnoses:  Encounter for screening for COVID-19  COVID  Family history of early CAD     Discharge Instructions      COVID test will be completed in 48 hours.  If you do not hear from the office in 48 hours that indicates the test is negative.  Log onto MyChart to review the test results when it post in 48 hours.  Advised to follow CDC guidelines with masking and isolation for 5 days from the start of symptoms or positive test.  Advised take Tylenol or ibuprofen as needed for body aches, pain or fever. Advised to take Mucinex DM or Robitussin DM for cough and congestion.  Advised follow-up PCP or return to urgent care as needed.    ED Prescriptions   None    PDMP not reviewed this encounter.   Nyoka Lint, PA-C 08/20/22 [phone]

## 2022-08-21 LAB — SARS CORONAVIRUS 2 (TAT 6-24 HRS): SARS Coronavirus 2: POSITIVE — AB
